# Patient Record
Sex: Female | Born: 1990 | Race: White | Hispanic: No | Marital: Single
Health system: Southern US, Community
[De-identification: ages and names within clinical notes are randomized; demographics above are authoritative.]

---

## 2012-07-04 ENCOUNTER — Inpatient Hospital Stay: Payer: Self-pay | Admitting: Internal Medicine

## 2012-07-04 LAB — COMPREHENSIVE METABOLIC PANEL
Albumin: 4.1 g/dL (ref 3.8–5.6)
Alkaline Phosphatase: 73 U/L — ABNORMAL LOW (ref 82–169)
Anion Gap: 11 (ref 7–16)
Calcium, Total: 9.4 mg/dL (ref 9.0–10.7)
Chloride: 104 mmol/L (ref 98–107)
EGFR (African American): >60
Glucose: 118 mg/dL — ABNORMAL HIGH (ref 65–99)
Osmolality: 278 (ref 275–301)
Potassium: 3.5 mmol/L (ref 3.5–5.1)
SGOT(AST): 26 U/L (ref 0–26)
Sodium: 140 mmol/L (ref 136–145)

## 2012-07-04 LAB — CBC WITH DIFFERENTIAL/PLATELET
Basophil #: 0 10*3/uL (ref 0.0–0.1)
MCHC: 35 g/dL (ref 32.0–36.0)
MCV: 86 fL (ref 80–100)
Monocyte #: 0.6 x10 3/mm (ref 0.2–0.9)
Monocyte %: 8.7 %
Neutrophil #: 5.2 10*3/uL (ref 1.4–6.5)
Platelet: 217 10*3/uL (ref 150–440)
RBC: 4.52 10*6/uL (ref 3.80–5.20)
RDW: 12.5 % (ref 11.5–14.5)
WBC: 6.7 10*3/uL (ref 3.6–11.0)

## 2012-07-05 LAB — PREGNANCY, URINE: Pregnancy Test, Urine: NEGATIVE m[IU]/mL

## 2012-07-06 LAB — CBC WITH DIFFERENTIAL/PLATELET
Basophil %: 0.4 %
Eosinophil #: 0.3 10*3/uL (ref 0.0–0.7)
Eosinophil %: 8.5 %
HGB: 11.6 g/dL — ABNORMAL LOW (ref 12.0–16.0)
Lymphocyte #: 1.1 10*3/uL (ref 1.0–3.6)
Lymphocyte %: 28.2 %
MCH: 30.6 pg (ref 26.0–34.0)
MCV: 88 fL (ref 80–100)
Monocyte %: 10.1 %
Neutrophil %: 52.8 %
RBC: 3.78 10*6/uL — ABNORMAL LOW (ref 3.80–5.20)
RDW: 12.6 % (ref 11.5–14.5)
WBC: 3.8 10*3/uL (ref 3.6–11.0)

## 2012-07-09 LAB — CULTURE, BLOOD (SINGLE)

## 2013-07-27 IMAGING — CR DG CHEST 2V
1 series · 2 of 2 positions shown · non-contrast
Comparison: none

REASON FOR EXAM: pneumonia
COMMENTS:

PROCEDURE:     DXR - DXR CHEST PA (OR AP) AND LATERAL  - July 05, 2012  [DATE]
RESULT:     Comparison is made to prior study dated 07/04/2012.

[Series 1: w chest pa · 0.14mm/px · 2 of 2 slices shown]
[im 1/2]
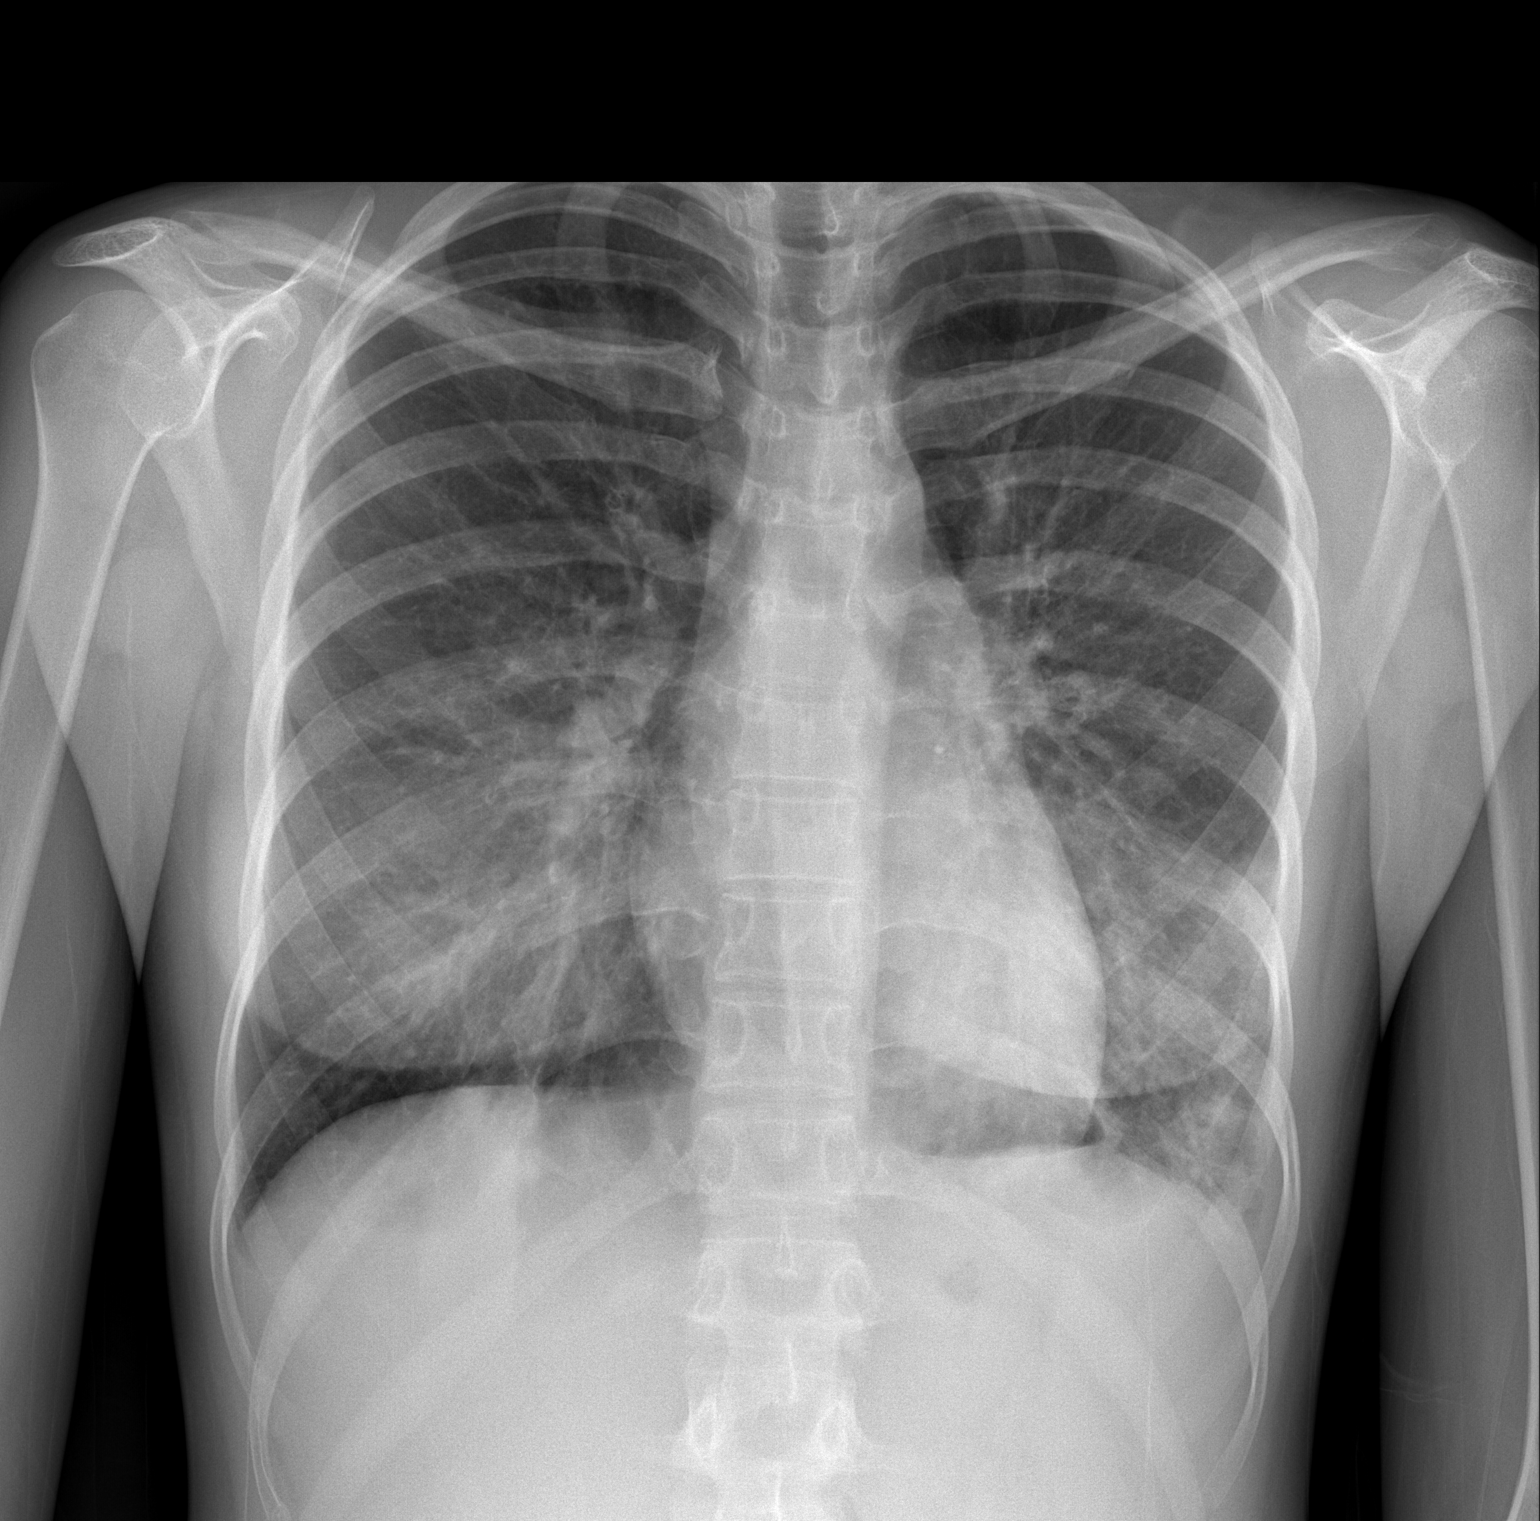
[im 2/2]
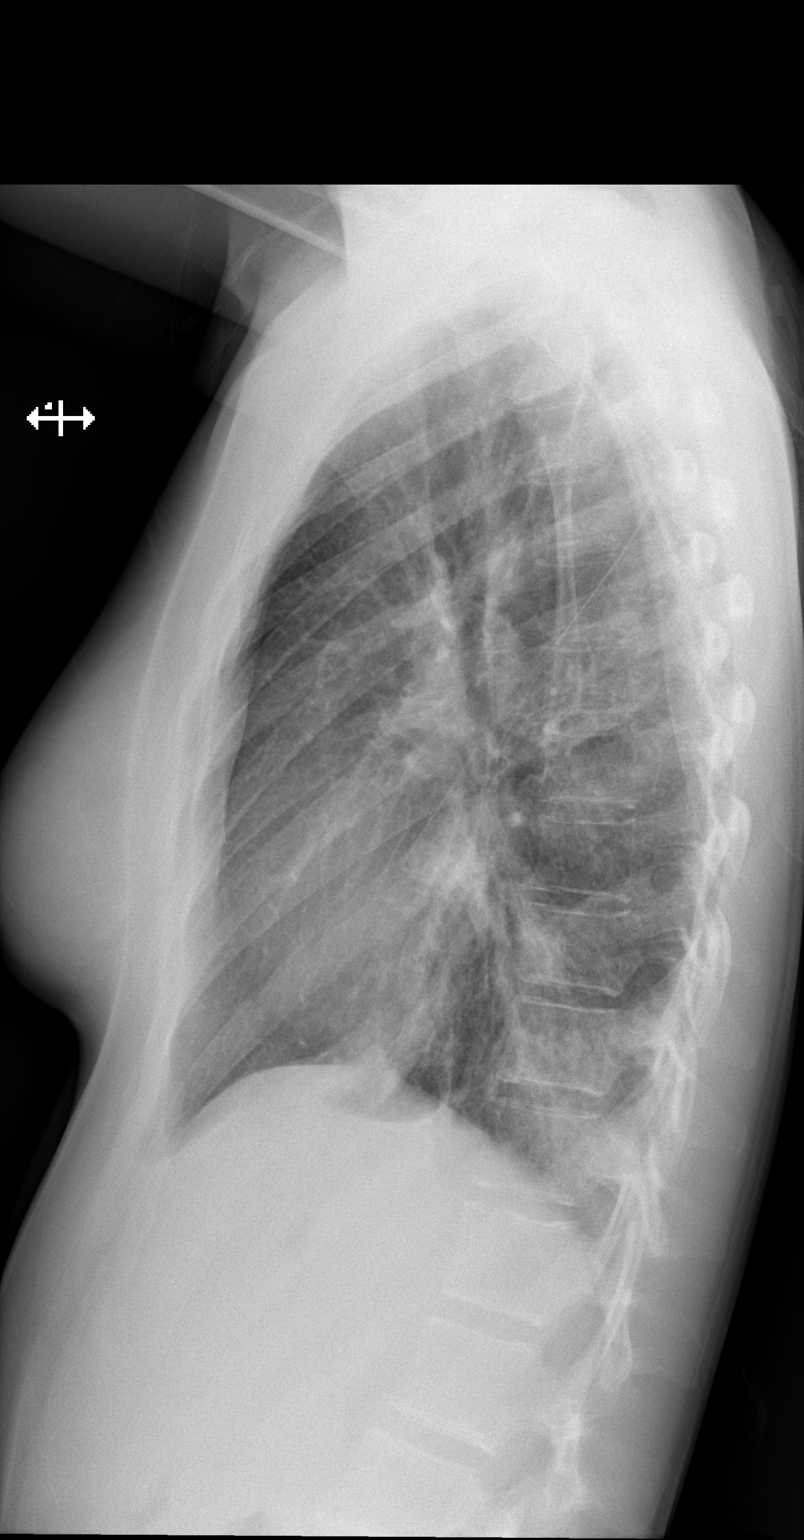

[2 of 2 positions shown; findings below may reference images not displayed]

FINDINGS: Bilateral perihilar opacities are identified as well as increased
density within the left lower lobe. The cardiac silhouette and visualized
bony skeleton are unremarkable.
IMPRESSION: 1. Bilateral perihilar infiltrates as well as infiltrate left lower lobe.
Surveillance evaluation recommended status post appropriate therapeutic
regimen.

## 2014-12-19 NOTE — H&P (Signed)
PATIENT NAME:  Kimberly Chang, Kimberly Chang MR#:  [id number] DATE OF BIRTH:  03-31-1991  DATE OF ADMISSION:  07/04/2012  PRIMARY CARE PHYSICIAN: She has no local doctor.   CHIEF COMPLAINT: Cough and right lower chest pain.   HISTORY OF PRESENT ILLNESS: Ms. Deeg is a 24 year old pleasant Caucasian female with past medical history of asthma and multiple allergies. She was in her usual state of health until the last one week when she developed persistent cough, mostly dry with occasional mucous formation. She was seen at the health center at her school, at [facility] [facility], and she was treated for bronchitis using amoxicillin 875 mg twice a day. However, her symptoms persisted and shortness of breath and cough worsened. Additionally, she developed right lower chest pain worse upon coughing and associated with fever over the last 24 hours. The patient came to the emergency department for evaluation and findings here are consistent with bronchopneumonia. The patient was also tachycardic and it was felt that her condition needs to be treated as an inpatient. The patient was admitted for further evaluation and management.   REVIEW OF SYSTEMS: CONSTITUTIONAL: She reports fever over the last 24 hours, although she did not check it. No chills. No fatigue. EYES: No blurring of vision. No double vision. ENT: No hearing impairment. No sore throat. No dysphagia. CARDIOVASCULAR: Reports chest pain located at the right lower chest and right upper quadrant area as well. Reports shortness of breath getting worse over the last couple of days. No edema. No syncope. RESPIRATORY: Reports cough and chest pain. No hemoptysis. GASTROINTESTINAL: No abdominal pain other than the right lower chest and right upper quadrant pain. No vomiting, no diarrhea, and no melena. GENITOURINARY: No dysuria. No frequency of urination. MUSCULOSKELETAL: No joint pain or swelling. No muscular pain or swelling. INTEGUMENTARY: No skin rash and no ulcers. NEUROLOGY: No  focal weakness. No seizure activity. No headache. PSYCHIATRY: No anxiety. No depression. ENDOCRINE: No polyuria or polydipsia. No heat or cold intolerance.   PAST MEDICAL HISTORY:  1. History of asthma.  2. Allergic to some antibiotics including ciprofloxacin and Omnicef causing anaphylactic reaction. She is also allergic to dogs, cats, and eggs.  PAST SURGICAL HISTORY: Negative.   SOCIAL HABITS: Nonsmoker. No history of alcohol or drug abuse.   FAMILY HISTORY: Her mother suffers from asthma and hypertension. Her father has allergies to animals, dogs and cats in particular.   ADMISSION MEDICATIONS:  1. Amoxicillin 875 mg twice a day, started a few days ago. 2. Albuterol inhaler.   ALLERGIES: Ciprofloxacin and Omnicef causing anaphylactic reaction. The patient also tells me she is allergic to Claritin but does not remember the type of reaction. She is also allergic to eggs. She is also allergic to dogs and cats.  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   SOCIAL HABITS: Nonsmoker. No history of alcohol or drug abuse.   PHYSICAL EXAMINATION:   VITAL SIGNS: Blood pressure 108/71, respiratory rate 40 per minute, pulse 155, temperature 99.9, and oxygen saturation 94%.   GENERAL APPEARANCE: Young female lying in bed in no acute distress, other than tachypnea.   HEAD/NECK: No pallor. No icterus. No cyanosis.   ENT: Hearing was normal. Nasal mucosa, lips, and tongue were normal.   EYES: Normal eyelids. The conjunctivae are congested. Pupils are about 5 mm, equal and reactive to light.   NECK: Supple. Trachea at midline. No thyromegaly. No cervical lymphadenopathy. No masses.   CARDIOVASCULAR: Normal S1 and S2.  No S3 or S4. No murmur. No gallop. No carotid bruits.   RESPIRATORY: The patient is tachypneic. Nevertheless she is not using accessory muscles. I did not hear any rales. No wheezing.   ABDOMEN: Soft without tenderness. No  hepatosplenomegaly. No masses. No hernias.   SKIN: No ulcers. No subcutaneous nodules.   MUSCULOSKELETAL: No joint swelling. No clubbing.   NEUROLOGIC: Cranial nerves II through XII are intact. No focal motor deficit.   PSYCHIATRY: The patient is alert and oriented x3. Mood and affect were normal.  LABORATORY, DIAGNOSTIC AND RADIOLOGIC DATA: Chest x-ray showed right lower lung zone bronchopneumonic infiltrates. It appears that there are some infiltrates on the left lower lung field as well.   EKG showed sinus tachycardia at rate of 131 per minute, incomplete right bundle branch block, nonspecific T wave abnormalities, otherwise unremarkable EKG.  CBC showed white count of 6700, hemoglobin 13.7, hematocrit 39, and platelet count 217. Liver function tests were normal. Serum glucose is 118, BUN 7, creatinine 0.75, sodium 140, and potassium 3.5.   ASSESSMENT:  1. Bronchopneumonia. 2. Asthma. 3. Multiple allergies to medications, animals, eggs, and dairy products.   PLAN: We will admit the patient to the medical floor. Blood cultures x2 were taken. Sputum for culture was ordered. Zithromax intravenously will be initiated. Since she is allergic to ciprofloxacin and Omnicef, this will eliminate Rocephin and Levaquin as choices. Therefore, we placed the patient on Unasyn. Since the patient had tolerated amoxicillin very well, we assume that she will tolerate ampicillin. We will check her hCG to make sure she is not pregnant, although her last menstrual period was a week ago. Oxygen supplementation. We will monitor      her condition closely. I discussed her clinical condition and her allergies with her mother over the telephone. Her mother is living in [place].  TIME SPENT EVALUATING THIS PATIENT: More than 55 minutes.  ____________________________ Carney CornersAmir M. Rudene Rearwish, MD amd:slb D: 07/04/2012 04:46:06 ET T: 07/04/2012 11:47:44 ET JOB#: [id number]  cc: Carney CornersAmir M. Rudene Rearwish, MD, <Dictator> Zollie ScaleAMIR M  Benen Weida MD ELECTRONICALLY SIGNED 07/05/2012 3:38

## 2014-12-19 NOTE — Discharge Summary (Signed)
PATIENT NAME:  Kimberly Chang, Kimberly Chang MR#:  [id number] DATE OF BIRTH:  1991/07/08  DATE OF ADMISSION:  07/04/2012 DATE OF DISCHARGE:  07/06/2012  DISCHARGE DIAGNOSES:  1. Pneumonia, clinically improving.  2. Tachycardia likely due to pneumonia, improving.  3. Hypotension likely due to pneumonia, improving.   SECONDARY DIAGNOSIS: History of asthma.   CONSULTATION: Infectious disease, Dr. Leavy CellaBlocker.   LABORATORY, DIAGNOSTIC AND RADIOLOGICAL DATA: Chest x-ray on 11/03 showed no acute cardiopulmonary disease.   Chest x-ray on 11/04 showed bilateral perihilar infiltrate as well as infiltrate at the left lower lobe.   Blood cultures x2 were negative on 11/03. Influenza A and B antigens were negative. Urine Legionella and histoplasma antigens were pending on the date of discharge.   Urine pregnancy test was negative.   HISTORY AND SHORT HOSPITAL COURSE: Patient is a 24 year old female with no significant medical problems was admitted for possible pneumonia. Please see Dr. Riley Nearingarwish's dictated history and physical for further details. Considering her multiple medication (antibiotic) allergies infectious disease consult was obtained with Dr. Leavy CellaBlocker who recommended continuing Zithromax and finish total seven days of course. Patient was slowly improving clinically and was stable enough to be discharged home on 11/05.   PHYSICAL EXAMINATION: VITAL SIGNS: On the date of discharge her vital signs were as follows: Temperature 99, heart rate 82 per minute, respirations 18 per minute, blood pressure 99/66 mmHg. She was saturating 95% on room air. Pertinent physical examination on the date of discharge: CARDIOVASCULAR: S1, S2 normal. No murmurs, rales, or gallop. LUNGS: Clear to auscultation bilaterally. No wheezing, rales, rhonchi, crepitation. ABDOMEN: Soft, benign. NEUROLOGIC: Nonfocal examination. All other physical examination remained at the baseline.   DISCHARGE MEDICATIONS:  1. Zithromax 250 mg p.o. daily for six  more days. 2. ProAir HFA 2 puffs inhaled every six hours as needed.  3. Ibuprofen 600 mg p.o. every eight hours as needed.  4. Benzonatate 100 mg p.o. every six hours as needed.   DISCHARGE DIET: Regular.   DISCHARGE ACTIVITY: As tolerated.   DISCHARGE INSTRUCTIONS AND FOLLOW UP: Patient was instructed to follow up with her new primary care physician/ID, Dr. Leavy CellaBlocker, early next week. She was instructed to take rest and using heating pad for another week until she sees Dr. Leavy CellaBlocker. She was also given excuse from her school for another week until she sees Dr. Leavy CellaBlocker in the office.   TOTAL TIME DISCHARGING THIS PATIENT: 55 minutes.   ____________________________ Ellamae SiaVipul S. Sherryll BurgerShah, MD vss:cms D: 07/06/2012 23:39:33 ET T: 07/07/2012 11:30:25 ET JOB#: [id number]  cc: Elinore Shults S. Sherryll BurgerShah, MD, <Dictator> Rosalyn GessMichael E. Blocker, MD Patricia PesaVIPUL S Jeston Junkins MD ELECTRONICALLY SIGNED 07/07/2012 17:42

## 2014-12-19 NOTE — Consult Note (Signed)
Impression: 24yo WF w/ h/o asthma who was admitted with pneumonia.  Her CXR shows an atypical pattern.  While Staph and Strep pneumonia can cause diffuse findings on CXR, they usually produce a more focal infiltrate.  Atypical patterns are often associated with viral pneumonia, Chlamydia, Mycoplasma and Legionella.  [place]. Histoplasmosis is endemic there and can cause an atypical pneumonia.   Will send serology for Chlamydia and Mycoplasma. 3) Legionella Ag has been ordered. 4) Will send Histo Ag. 5) Continue azithromycin.  This will cover several of the atypical organisms. 6) Pneumonia is atypical in young people.  Will send HIV Ab and PCR.  Her only risk factor is sexual contact. 7) If she does well, would consider changing to po azithro tomorrow. 8) I will plan on seeing her in the office in follow up a week after discharge.   Electronic Signatures: Nazifa Trinka, Rosalyn GessMichael E (MD) (Signed on 04-Nov-13 15:06)  Authored   Last Updated: 04-Nov-13 15:21 by Tammy Wickliffe, Rosalyn GessMichael E (MD)

## 2014-12-19 NOTE — Consult Note (Signed)
PATIENT NAME:  Kimberly Chang, Kimberly Chang MR#:  [id number] DATE OF BIRTH:  [birth year]  DATE OF CONSULTATION:  07/05/2012  REFERRING PHYSICIAN:  Dr Sherryll Burger CONSULTING PHYSICIAN:  Rosalyn Gess. Nisa Decaire, MD  REASON FOR CONSULTATION: Pneumonia.   HISTORY OF PRESENT ILLNESS: The patient is a 24 year old white female with a past history significant for asthma and multiple food and medication allergies who was admitted on 07/04/2012 with approximately 2-week history of cough, shortness of breath, and fevers and chills. She had recently returned home to [facility] from [facility]. Within a few weeks of coming back she began having her current symptoms. She had also traveled to [facility] a few weeks before that to visit a friend. She returned to [place] but after her symptoms had developed. She had [facility] by [facility] and was given Augmentin 875 mg p.o. b.i.d. Her symptoms continued to bother her, and she began developing pleuritic chest pain in the anterior chest. The pain is worse with deep breathing and coughing. She has had some greenish sputum production as well. She has also felt some wheezing related to her underlying asthma. She does not have any known sick contacts.   HIV RISK FACTORS: She has had more than 1 sexual partner. She uses condoms all the time.  No injecting drug use history. No tattoos. No blood transfusions.   HOSPITAL COURSE: Patient was initially given Unasyn and azithromycin. The Unasyn was changed to doxycycline. She did not tolerate the doxycycline due to nausea. She is currently on azithromycin alone.   ALLERGIES: Cipro, Omnicef, dairy products, and eggs (which cause anaphylaxis).    PAST MEDICAL HISTORY:  1. Asthma.  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   FAMILY HISTORY: Positive for asthma and hypertension.   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 She denies any injecting drug use. She does not drink. She has had more than 1 sexual partner in the  past but uses condoms all the time. She has recently traveled back home to [place] and down to [facility] in the time period prior to her developing illness. She does not have any known sick contacts. She has no animal exposures. She has no toxin exposures. She has not been around anyone who has been ill.   REVIEW OF SYSTEMS. GENERAL: Positive fevers and chills, some sweats, some malaise. HEENT: Some headache. No sinus congestion. Moderate sore throat. NECK: No swollen glands. No stiff neck. RESPIRATORY: Positive cough with occasional sputum production, some wheezing, some shortness of breath. She does have the pleuritic chest pain but no exertional chest pain. She has no peripheral edema. No palpitations. GI: Nausea with the doxycycline but prior to admission she has had no nausea, vomiting, no abdominal pain, no change in her bowels. GENITOURINARY: No change in her urine. MUSCULOSKELETAL: She has had no myalgias or arthralgias. She has had no frank joint inflammation. SKIN: No rashes. NEUROLOGIC: No focal weakness. PSYCHIATRIC: No complaints. All other systems are negative.   PHYSICAL EXAMINATION:  VITAL SIGNS: T-max of 99.4, T-current of 97.9, pulse 77, blood pressure 97/65, 96% on room air.   GENERAL: A 24 year old white female in no acute distress.   HEENT: Normocephalic, atraumatic. Pupils equal, reactive to light. Extraocular motion intact. Sclerae, conjunctivae, and lids are without evidence for emboli or petechiae. Oropharynx shows no erythema or exudate. Teeth and gums are in good condition.   NECK: Supple. Full range of motion. Midline trachea. No lymphadenopathy. No thyromegaly.   CHEST: She has  bilateral crackles. No focal consolidation. There is no wheezing apparent. She was able to speak in full sentences.   CARDIAC: Regular rate and rhythm without murmur, rub, or gallop.   ABDOMEN: Soft, nontender, nondistended. No hepatosplenomegaly. No hernia is noted.   EXTREMITIES: No  evidence for tenosynovitis.   SKIN: No rashes. No stigmata of endocarditis, specifically no Janeway lesions or Osler neck.   NEUROLOGIC: The patient is awake and interactive, moving all 4 extremities.   PSYCHIATRIC: Mood and affect appeared normal.   LABORATORY DATA: BUN of 7, creatinine 0.75, bicarbonate 25, anion gap 11. LFTs were unremarkable. White count is 6.7, hemoglobin 13.7, platelet count 217,000. ANC of 5.2. Blood cultures show no growth. Urine pregnancy test is negative. A chest x-ray from admission shows no infiltrates. Repeat chest x-ray showed bilateral perihilar infiltrates as well as an infiltrate in the left lower lobe.   IMPRESSION: A 24 year old white female with a history of asthma, who is admitted with pneumonia.   RECOMMENDATIONS:  1. Her chest x-ray shows a somewhat atypical pattern with bilateral hilar infiltrates as well as some lobar infiltrate. While staph and strep pneumonia can cause diffuse findings on x-rays, they usually produce a more focal infiltrate. Atypical patterns are often associated with viral pneumonia, chlamydia, and mycoplasma, and Legionella. [place]. Histoplasmosis is endemic there and can cause an atypical pneumonia.  2. We will send serologies for chlamydia and mycoplasma.  3. Legionella antigens are ordered.  4. We will send a urine histo antigen.  5. We will continue to use azithromycin. This will cover several of the atypical organisms and also has some antistrep affect.  6. Pneumonia is atypical in young people. We will send an HIV antibody and PCR. Her only risk factor is sexual contact.  7. If she does well would consider changing her to p.o. azithromycin tomorrow.  8. I will plan on seeing him in the office in followup in a week after discharge.   This a moderately complex infectious disease case. Thank you very much for involving me in Ms. Bordeau's care.    ____________________________ Rosalyn GessMichael E. Lailany Enoch, MD meb:vtd D: 07/05/2012 15:31:37 ET T: 07/05/2012 16:12:27 ET JOB#: [id number] cc: Rosalyn GessMichael E. Trapper Meech, MD, <Dictator> Duel Conrad E Danner Paulding MD ELECTRONICALLY SIGNED 07/08/2012 12:25
